# Patient Record
Sex: Male | Born: 1965 | Race: White | Hispanic: No | Marital: Married | Smoking: Never smoker
Health system: Southern US, Community
[De-identification: ages and names within clinical notes are randomized; demographics above are authoritative.]

## PROBLEM LIST (undated history)

## (undated) DIAGNOSIS — H409 Unspecified glaucoma: Secondary | ICD-10-CM

## (undated) DIAGNOSIS — H548 Legal blindness, as defined in USA: Secondary | ICD-10-CM

## (undated) DIAGNOSIS — H3553 Other dystrophies primarily involving the sensory retina: Secondary | ICD-10-CM

---

## 2019-04-29 ENCOUNTER — Emergency Department (HOSPITAL_COMMUNITY): Payer: Federal, State, Local not specified - PPO

## 2019-04-29 ENCOUNTER — Other Ambulatory Visit: Payer: Self-pay

## 2019-04-29 ENCOUNTER — Emergency Department (HOSPITAL_BASED_OUTPATIENT_CLINIC_OR_DEPARTMENT_OTHER): Payer: Federal, State, Local not specified - PPO

## 2019-04-29 ENCOUNTER — Emergency Department (HOSPITAL_BASED_OUTPATIENT_CLINIC_OR_DEPARTMENT_OTHER)
Admission: EM | Admit: 2019-04-29 | Discharge: 2019-04-30 | Disposition: A | Discharge status: Home / Self Care | Payer: Federal, State, Local not specified - PPO | Attending: Emergency Medicine | Admitting: Emergency Medicine

## 2019-04-29 ENCOUNTER — Encounter (HOSPITAL_BASED_OUTPATIENT_CLINIC_OR_DEPARTMENT_OTHER): Payer: Self-pay

## 2019-04-29 DIAGNOSIS — Y999 Unspecified external cause status: Secondary | ICD-10-CM | POA: Diagnosis not present

## 2019-04-29 DIAGNOSIS — W0110XA Fall on same level from slipping, tripping and stumbling with subsequent striking against unspecified object, initial encounter: Secondary | ICD-10-CM | POA: Diagnosis not present

## 2019-04-29 DIAGNOSIS — S199XXA Unspecified injury of neck, initial encounter: Secondary | ICD-10-CM | POA: Diagnosis present

## 2019-04-29 DIAGNOSIS — M542 Cervicalgia: Secondary | ICD-10-CM

## 2019-04-29 DIAGNOSIS — S14105A Unspecified injury at C5 level of cervical spinal cord, initial encounter: Secondary | ICD-10-CM | POA: Diagnosis not present

## 2019-04-29 DIAGNOSIS — Z20828 Contact with and (suspected) exposure to other viral communicable diseases: Secondary | ICD-10-CM | POA: Insufficient documentation

## 2019-04-29 DIAGNOSIS — R200 Anesthesia of skin: Secondary | ICD-10-CM | POA: Diagnosis not present

## 2019-04-29 DIAGNOSIS — Y929 Unspecified place or not applicable: Secondary | ICD-10-CM | POA: Insufficient documentation

## 2019-04-29 DIAGNOSIS — Y939 Activity, unspecified: Secondary | ICD-10-CM | POA: Insufficient documentation

## 2019-04-29 HISTORY — DX: Legal blindness, as defined in USA: H54.8

## 2019-04-29 HISTORY — DX: Other dystrophies primarily involving the sensory retina: H35.53

## 2019-04-29 HISTORY — DX: Unspecified glaucoma: H40.9

## 2019-04-29 LAB — BASIC METABOLIC PANEL
Anion gap: 12 (ref 5–15)
BUN: 7 mg/dL (ref 6–20)
CO2: 20 mmol/L — ABNORMAL LOW (ref 22–32)
Calcium: 8.6 mg/dL — ABNORMAL LOW (ref 8.9–10.3)
Chloride: 98 mmol/L (ref 98–111)
Creatinine, Ser: 0.84 mg/dL (ref 0.61–1.24)
GFR calc Af Amer: >60 mL/min (ref >60)
GFR calc non Af Amer: >60 mL/min (ref >60)
Glucose, Bld: 114 mg/dL — ABNORMAL HIGH (ref 70–99)
Potassium: 4 mmol/L (ref 3.5–5.1)
Sodium: 130 mmol/L — ABNORMAL LOW (ref 135–145)

## 2019-04-29 LAB — CBC
HCT: 42 % (ref 39.0–52.0)
Hemoglobin: 15 g/dL (ref 13.0–17.0)
MCH: 33.6 pg (ref 26.0–34.0)
MCHC: 35.7 g/dL (ref 30.0–36.0)
MCV: 94.2 fL (ref 80.0–100.0)
Platelets: 143 10*3/uL — ABNORMAL LOW (ref 150–400)
RBC: 4.46 MIL/uL (ref 4.22–5.81)
RDW: 11.5 % (ref 11.5–15.5)
WBC: 10.5 10*3/uL (ref 4.0–10.5)
nRBC: 0 % (ref 0.0–0.2)

## 2019-04-29 LAB — SARS CORONAVIRUS 2 AG (30 MIN TAT): SARS Coronavirus 2 Ag: NEGATIVE

## 2019-04-29 NOTE — ED Notes (Signed)
Called [facility] - s/w Kim - Advised Covid test was negative - patient ready to be transported to ED - Maudie Mercury advised it would be a while

## 2019-04-29 NOTE — ED Provider Notes (Signed)
[facility] EMERGENCY DEPARTMENT Provider Note   CSN: [id number] Arrival date & time: 04/29/19  1845    History   Chief Complaint Chief Complaint  Patient presents with   Fall    HPI Seth Schaefer is a 53 y.o. male.     Patient legally blind presents with neck pain after mechanical fall.  Patient tripped and fell and hit the back of his neck and had sudden pain and then numbness across both upper arms.  This extended now he has numbness in his hands bilateral and to temperature they feel different.  No history of neck surgery.  No leg symptoms.  Patient has not urinated yet.  No other significant injuries     Past Medical History:  Diagnosis Date   Glaucoma    Legally blind    Stargardts disease     There are no active problems to display for this patient.   History reviewed. No pertinent surgical history.      Home Medications    Prior to Admission medications   Not on File    Family History History reviewed. No pertinent family history.  Social History Social History   Tobacco Use   Smoking status: Never Smoker   Smokeless tobacco: Current User  Substance Use Topics   Alcohol use: Yes    Comment: daily   Drug use: Never     Allergies   Patient has no known allergies.   Review of Systems Review of Systems  Constitutional: Negative for chills and fever.  HENT: Negative for congestion.   Eyes: Negative for visual disturbance.  Respiratory: Negative for shortness of breath.   Cardiovascular: Negative for chest pain.  Gastrointestinal: Negative for abdominal pain and vomiting.  Genitourinary: Negative for dysuria and flank pain.  Musculoskeletal: Positive for neck pain. Negative for back pain and neck stiffness.  Skin: Negative for rash.  Neurological: Positive for numbness. Negative for light-headedness and headaches.     Physical Exam Updated Vital Signs BP (!) 184/90 (BP Location: Right Arm)    Pulse 86    Temp 99 F  (37.2 C) (Oral)    Resp 18    Ht 5\' 8"  (1.727 m)    Wt 81.6 kg    SpO2 98%    BMI 27.37 kg/m   Physical Exam Vitals signs and nursing note reviewed.  Constitutional:      Appearance: He is well-developed.  HENT:     Head: Normocephalic and atraumatic.  Eyes:     General:        Right eye: No discharge.        Left eye: No discharge.     Conjunctiva/sclera: Conjunctivae normal.  Neck:     Musculoskeletal: Normal range of motion and neck supple.     Trachea: No tracheal deviation.  Cardiovascular:     Rate and Rhythm: Normal rate and regular rhythm.  Pulmonary:     Effort: Pulmonary effort is normal.     Breath sounds: Normal breath sounds.  Abdominal:     General: There is no distension.     Palpations: Abdomen is soft.     Tenderness: There is no abdominal tenderness. There is no guarding.  Musculoskeletal:     Comments: Patient has subjective numbness to hands and forearms bilateral palpation.  Patient has 5+ strength with flexion-extension of major joints in upper and lower extremities.  C-collar in place patient has mild midline and paraspinal mid cervical tenderness.  Skin:  General: Skin is warm.     Findings: No rash.  Neurological:     Mental Status: He is alert and oriented to person, place, and time.     GCS: GCS eye subscore is 4. GCS verbal subscore is 5. GCS motor subscore is 6.     Comments: Patient has normal strength upper and lower extremities equal bilateral.  Equal lower extremity reflexes 2+.      ED Treatments / Results  Labs (all labs ordered are listed, but only abnormal results are displayed) Labs Reviewed  CBC - Abnormal; Notable for the following components:      Result Value   Platelets 143 (*)    All other components within normal limits  BASIC METABOLIC PANEL - Abnormal; Notable for the following components:   Sodium 130 (*)    CO2 20 (*)    Glucose, Bld 114 (*)    Calcium 8.6 (*)    All other components within normal limits  SARS  CORONAVIRUS 2 (HOSP ORDER, PERFORMED IN [facility] VIA [facility] ID)    EKG None  Radiology Ct Head Wo Contrast  Result Date: 04/29/2019 CLINICAL DATA:  Head injury after fall. Positive loss of consciousness. EXAM: CT HEAD WITHOUT CONTRAST CT CERVICAL SPINE WITHOUT CONTRAST TECHNIQUE: Multidetector CT imaging of the head and cervical spine was performed following the standard protocol without intravenous contrast. Multiplanar CT image reconstructions of the cervical spine were also generated. COMPARISON:  None. FINDINGS: CT HEAD FINDINGS Brain: No evidence of acute infarction, hemorrhage, hydrocephalus, extra-axial collection or mass lesion/mass effect. Vascular: No hyperdense vessel or unexpected calcification. Skull: Normal. Negative for fracture or focal lesion. Sinuses/Orbits: No acute finding. Other: None. CT CERVICAL SPINE FINDINGS Alignment: Normal. Skull base and vertebrae: No acute fracture. No primary bone lesion or focal pathologic process. Soft tissues and spinal canal: No prevertebral fluid or swelling. No visible canal hematoma. Disc levels: Moderate degenerative disc disease is noted at C5-6 and C6-7 with anterior posterior osteophyte formation. Upper chest: Negative. Other: None. IMPRESSION: Normal head CT. Moderate multilevel degenerative disc disease. No acute abnormality seen in the cervical spine. Electronically Signed   By: Marijo Conception M.D.   On: 04/29/2019 20:16   Ct Cervical Spine Wo Contrast  Result Date: 04/29/2019 CLINICAL DATA:  Head injury after fall. Positive loss of consciousness. EXAM: CT HEAD WITHOUT CONTRAST CT CERVICAL SPINE WITHOUT CONTRAST TECHNIQUE: Multidetector CT imaging of the head and cervical spine was performed following the standard protocol without intravenous contrast. Multiplanar CT image reconstructions of the cervical spine were also generated. COMPARISON:  None. FINDINGS: CT HEAD FINDINGS Brain: No evidence of acute infarction, hemorrhage,  hydrocephalus, extra-axial collection or mass lesion/mass effect. Vascular: No hyperdense vessel or unexpected calcification. Skull: Normal. Negative for fracture or focal lesion. Sinuses/Orbits: No acute finding. Other: None. CT CERVICAL SPINE FINDINGS Alignment: Normal. Skull base and vertebrae: No acute fracture. No primary bone lesion or focal pathologic process. Soft tissues and spinal canal: No prevertebral fluid or swelling. No visible canal hematoma. Disc levels: Moderate degenerative disc disease is noted at C5-6 and C6-7 with anterior posterior osteophyte formation. Upper chest: Negative. Other: None. IMPRESSION: Normal head CT. Moderate multilevel degenerative disc disease. No acute abnormality seen in the cervical spine. Electronically Signed   By: Marijo Conception M.D.   On: 04/29/2019 20:16    Procedures Procedures (including critical care time)  Medications Ordered in ED Medications - No data to display   Initial Impression / Assessment  and Plan / ED Course  I have reviewed the triage vital signs and the nursing notes.  Pertinent labs & imaging results that were available during my care of the patient were reviewed by me and considered in my medical decision making (see chart for details).       Patient presents with mechanical fall and neck injury.  Concern for musculoskeletal versus central cord with numbness in both arms/hands.  CT scan obtained no acute findings.  Discussed with Dr. Jacqulyn Bathlong to transfer for MRI this evening of cervical spine.  Basic blood work performed, sodium 130, no other significant findings.    Final Clinical Impressions(s) / ED Diagnoses   Final diagnoses:  Bilateral hand numbness  Acute neck pain    ED Discharge Orders    None       Blane OharaZavitz, Arsenia Goracke, MD 04/30/19 0009

## 2019-04-29 NOTE — ED Triage Notes (Signed)
Pt sates he tripped/fell ~330pm-c/o pain to posterior neck and numbness to bilat UE-NAD-steady gait

## 2019-04-29 NOTE — ED Notes (Signed)
Report  Called to [facility] ED [place] ed

## 2019-04-29 NOTE — ED Notes (Signed)
Called [facility] - s/w Maudie Mercury - requested consult with [facility] ED physician - patient will need to be transported and get an MRI

## 2019-04-29 NOTE — ED Triage Notes (Signed)
Pt arrives via [facility] from med center after pt fell. No LOC, no blood thinners. C-collar stabilization in place. CT at med center negative, to [facility] ED for MRI. Pt c/o of tingling pain in bilateral hands.

## 2019-04-30 NOTE — ED Notes (Signed)
Pt unable to provide e-signature at discharge.

## 2019-04-30 NOTE — ED Notes (Signed)
Pt describes pain as a sharp, shooting, tingling sensation that that radiates through his fingertips intermittently. Pain score 7/10

## 2019-04-30 NOTE — Consult Note (Signed)
Chief Complaint   Chief Complaint  Patient presents with  . Fall    HPI   Consult requested by: Dr Clayborne DanaMesner Reason for consult: cervical cord contusion  HPI: Seth Schaefer is a 53 y.o. male who presented to [facility] for evaluation of neck pain after a mechanical fall. Reports tripping over rug and striking back of neck. Developed immediate neck pain and interscapular N/T that radiated down BUE. Due to concern over cervical cord injury, [facility]. MRI revealed mild cord contusion at C5. NSY consultation requested. Since the fall which occurred >12 hours ago, symptoms have been gradually improving. His only remaining symptom is intermittent, sharp/shooting pain in digits 2&3 bilaterally. He denies weakness in BUE/BLE extremities, bowel/bladder dysfunction, N/T.  There are no active problems to display for this patient.   PMH: Past Medical History:  Diagnosis Date  . Glaucoma   . Legally blind   . Stargardts disease     PSH: History reviewed. No pertinent surgical history.  (Not in a hospital admission)   SH: Social History   Tobacco Use  . Smoking status: Never Smoker  . Smokeless tobacco: Current User  Substance Use Topics  . Alcohol use: Yes    Comment: daily  . Drug use: Never    MEDS: Prior to Admission medications   Not on File    ALLERGY: No Known Allergies  Social History   Tobacco Use  . Smoking status: Never Smoker  . Smokeless tobacco: Current User  Substance Use Topics  . Alcohol use: Yes    Comment: daily     History reviewed. No pertinent family history.   ROS   Review of Systems  Constitutional: Negative.   HENT: Negative.   Eyes: Negative.   Respiratory: Negative.   Cardiovascular: Negative.   Gastrointestinal: Negative.   Genitourinary: Negative.   Musculoskeletal: Negative for myalgias and neck pain.  Skin: Negative.   Neurological: Positive for tingling. Negative for dizziness, tremors,  sensory change, speech change, focal weakness, seizures, loss of consciousness, weakness and headaches.    Exam   Vitals:   04/29/19 2152 04/29/19 2315  BP: (!) 153/84 (!) 184/90  Pulse: 78 86  Resp: 18 18  Temp:  99 F (37.2 C)  SpO2: 100% 98%   General appearance: WDWN, NAD Eyes: No scleral injection Cardiovascular: Regular rate and rhythm without murmurs, rubs, gallops. No edema or variciosities. Distal pulses normal. Pulmonary: Effort normal, non-labored breathing Musculoskeletal:     Muscle tone upper extremities: Normal    Muscle tone lower extremities: Normal    Motor exam: Upper Extremities Deltoid Bicep Tricep Grip  Right 5/5 5/5 5/5 5/5  Left 5/5 5/5 5/5 5/5   Lower Extremity IP Quad PF DF EHL  Right 5/5 5/5 5/5 5/5 5/5  Left 5/5 5/5 5/5 5/5 5/5   Neurological Mental Status:    - Patient is awake, alert, oriented to person, place, month, year, and situation    - Patient is able to give a clear and coherent history.    - No signs of aphasia or neglect Cranial Nerves    - II: Visual Fields are full. PERRL    - III/IV/VI: EOMI without ptosis or diploplia.     - V: Facial sensation is grossly normal    - VII: Facial movement is symmetric.     - VIII: hearing is intact to voice    - X: Uvula elevates symmetrically    -  XI: Shoulder shrug is symmetric.    - XII: tongue is midline without atrophy or fasciculations.  Sensory: Sensation grossly intact to LT Deep Tendon Reflexes    - 3+ and symmetric in the biceps and patellae.  Plantars   - Toes are downgoing bilaterally.  Cerebellar    - FNF and HKS are intact bilaterally   Results - Imaging/Labs   Results for orders placed or performed during the hospital encounter of 04/29/19 (from the past 48 hour(s))  CBC     Status: Abnormal   Collection Time: 04/29/19  7:36 PM  Result Value Ref Range   WBC 10.5 4.0 - 10.5 K/uL   RBC 4.46 4.22 - 5.81 MIL/uL   Hemoglobin 15.0 13.0 - 17.0 g/dL   HCT 21.342.0 08.639.0 - 57.852.0  %   MCV 94.2 80.0 - 100.0 fL   MCH 33.6 26.0 - 34.0 pg   MCHC 35.7 30.0 - 36.0 g/dL   RDW 46.911.5 62.911.5 - 52.815.5 %   Platelets 143 (L) 150 - 400 K/uL   nRBC 0.0 0.0 - 0.2 %    Comment: Performed at [facility] [place], [address] [place], [place] [address]  Basic metabolic panel     Status: Abnormal   Collection Time: 04/29/19  7:36 PM  Result Value Ref Range   Sodium 130 (L) 135 - 145 mmol/L   Potassium 4.0 3.5 - 5.1 mmol/L   Chloride 98 98 - 111 mmol/L   CO2 20 (L) 22 - 32 mmol/L   Glucose, Bld 114 (H) 70 - 99 mg/dL   BUN 7 6 - 20 mg/dL   Creatinine, Ser 4.010.84 0.61 - 1.24 mg/dL   Calcium 8.6 (L) 8.9 - 10.3 mg/dL   GFR calc non Af Amer >60 >60 mL/min   GFR calc Af Amer >60 >60 mL/min   Anion gap 12 5 - 15    Comment: Performed at [facility] [place], [address] [place], [place] [address]  SARS Coronavirus 2 (Hosp order,Performed in [facility] lab via [facility] ID)     Status: None   Collection Time: 04/29/19  8:54 PM   Specimen: Dry Nasal Swab ([facility] ID Now)  Result Value Ref Range   SARS Coronavirus 2 ([facility]ott ID Now) NEGATIVE NEGATIVE    Comment: (NOTE) SARS-CoV-2 target nucleic acids are NOT DETECTED. The SARS-CoV-2 RNA is generally detectable in upper and lower respiratory specimens during the acute phase of infection.  Negativeresults do not preclude SARS-CoV-2 infection, do not rule out coinfections with other pathogens, and should not be used as the  sole basis for treatment or other patient management decisions.  Negative results must be combined with clinical observations, patient history, and epidemiological information. The expected result is Negative. Fact Sheet for Patients: http://www.graves-ford.org/https://www.fda.gov/media/136524/download Fact Sheet for Healthcare Providers: EnviroConcern.sihttps://www.fda.gov/media/136523/download This test is not yet approved or cleared by the [place] FDA and  has been authorized for detection and/or diagnosis of SARS-CoV-2 by FDA under an  Emergency Use Authorization (EUA).  This EUA will remain in effect (meaning this test can be used) for the duration of  the COVID19 declaration under Section 5 64(b)(1) of the Act, 21 U.S.C.  section 725 605 [id number] 3(b)(1), unless the authorization is terminated or revoked sooner. Performed at [facility] [place], [address] [place], [place] [address]     Ct Head Wo Contrast  Result Date: 04/29/2019 CLINICAL DATA:  Head injury after fall. Positive loss of consciousness. EXAM: CT  HEAD WITHOUT CONTRAST CT CERVICAL SPINE WITHOUT CONTRAST TECHNIQUE: Multidetector CT imaging of the head and cervical spine was performed following the standard protocol without intravenous contrast. Multiplanar CT image reconstructions of the cervical spine were also generated. COMPARISON:  None. FINDINGS: CT HEAD FINDINGS Brain: No evidence of acute infarction, hemorrhage, hydrocephalus, extra-axial collection or mass lesion/mass effect. Vascular: No hyperdense vessel or unexpected calcification. Skull: Normal. Negative for fracture or focal lesion. Sinuses/Orbits: No acute finding. Other: None. CT CERVICAL SPINE FINDINGS Alignment: Normal. Skull base and vertebrae: No acute fracture. No primary bone lesion or focal pathologic process. Soft tissues and spinal canal: No prevertebral fluid or swelling. No visible canal hematoma. Disc levels: Moderate degenerative disc disease is noted at C5-6 and C6-7 with anterior posterior osteophyte formation. Upper chest: Negative. Other: None. IMPRESSION: Normal head CT. Moderate multilevel degenerative disc disease. No acute abnormality seen in the cervical spine. Electronically Signed   By: Marijo Conception M.D.   On: 04/29/2019 20:16   Ct Cervical Spine Wo Contrast  Result Date: 04/29/2019 CLINICAL DATA:  Head injury after fall. Positive loss of consciousness. EXAM: CT HEAD WITHOUT CONTRAST CT CERVICAL SPINE WITHOUT CONTRAST TECHNIQUE: Multidetector CT imaging of the head and cervical  spine was performed following the standard protocol without intravenous contrast. Multiplanar CT image reconstructions of the cervical spine were also generated. COMPARISON:  None. FINDINGS: CT HEAD FINDINGS Brain: No evidence of acute infarction, hemorrhage, hydrocephalus, extra-axial collection or mass lesion/mass effect. Vascular: No hyperdense vessel or unexpected calcification. Skull: Normal. Negative for fracture or focal lesion. Sinuses/Orbits: No acute finding. Other: None. CT CERVICAL SPINE FINDINGS Alignment: Normal. Skull base and vertebrae: No acute fracture. No primary bone lesion or focal pathologic process. Soft tissues and spinal canal: No prevertebral fluid or swelling. No visible canal hematoma. Disc levels: Moderate degenerative disc disease is noted at C5-6 and C6-7 with anterior posterior osteophyte formation. Upper chest: Negative. Other: None. IMPRESSION: Normal head CT. Moderate multilevel degenerative disc disease. No acute abnormality seen in the cervical spine. Electronically Signed   By: Marijo Conception M.D.   On: 04/29/2019 20:16   Mr Cervical Spine Wo Contrast  Result Date: 04/30/2019 CLINICAL DATA:  53 y/o M; fall. Posterior neck pain and numbness to the lower extremities. EXAM: MRI CERVICAL SPINE WITHOUT CONTRAST TECHNIQUE: Multiplanar, multisequence MR imaging of the cervical spine was performed. No intravenous contrast was administered. COMPARISON:  None. FINDINGS: Alignment: Straightening of cervical lordosis without static malalignment. Vertebrae: No bone marrow edema to suggest acute fracture. No loss of vertebral body height. There is mild prevertebral edema extending from the C2 level to below the field of view. There is very mild edema within the anterior annulus at C4-5 and C6-7 at the levels of prominent anterior endplate osteophytes. Additionally, there is mild edema surrounding the bilateral C3-4 and C4-5 facets as well as the C4 and C5 spinous processes. There is no  associated malalignment of the facets, spinous processes, or vertebral bodies. No ligament discontinuity is identified. Cord: Increased T2 signal within the spinal cord sent and posterior horns, greater on the left, at the C5 level (series 8, image 20). No additional level of abnormal cord signal. Posterior Fossa, vertebral arteries, paraspinal tissues: As above. Disc levels: C2-3: Right greater than left uncovertebral and facet hypertrophy with mild right foraminal stenosis. No canal stenosis. C3-4: No significant disc displacement, foraminal stenosis, or canal stenosis. C4-5: Mild disc bulge. No significant foraminal or spinal canal stenosis. C5-6: Disc osteophyte complex with  prominent bilateral uncovertebral hypertrophy and mild facet hypertrophy. Moderate spinal canal stenosis with disc contact on the anterior cord and mild cord flattening. Moderate right and severe left neural foraminal stenosis. C6-7: Disc osteophyte complex with right greater than left uncovertebral and facet hypertrophy. Moderate right and mild left neural foraminal stenosis. Mild spinal canal stenosis. C7-T1: No significant disc displacement, foraminal stenosis, or canal stenosis. IMPRESSION: 1. Mild prevertebral edema and surrounding C3-4 and C4-5 facets as well as C4-5 spinous processes. No static malalignment, bone marrow edema, or ligament discontinuity. Findings probably represent mild ligamentous strain and possible facet capsular injury centered at C3-C5. 2. Mildly increased C5 cord signal, probably a mild cord contusion due to cervical trauma in the setting of a stenotic spinal canal due to degenerative disease at C5-6. These results were called by telephone at the time of interpretation on 04/30/2019 at 12:29 am to Dr. Erin HearingMessner, who verbally acknowledged these results. Electronically Signed   By: Mitzi HansenLance  Furusawa-Stratton M.D.   On: 04/30/2019 00:35   Impression/Plan   53 y.o. 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 No evidence of malalignment, fracture or ligament discontinuity on MRI. There is edema surrounding facet/spinous processes C3-5 and prevertebral edema.  He is neurologically intact on exam. Symptoms overall appear to be improving and essentially resolved. Case reviewed with Dr Conchita ParisNundkumar. No need for immediate NS intervention, although will likely need to have C5-6 spinal stenosis addressed in the future. Believe he is safe for d/c home with outpt follow up. Maintain aspen c collar at all times. Strong return precautions discussed.  Cindra PresumeVincent Camisha Srey, PA-C [facility]

## 2019-04-30 NOTE — ED Provider Notes (Signed)
2:02 AM Assumed care from Dr. Reather Converse at [facility] where there is no MRI, please see their note for full history, physical and decision making until this point. In brief this is a 53 y.o. year old male who presented to the ED tonight with Fall     Patient with a hyperextension injury after a fall.  Has paresthesias and weakness in his arms concerning for central cord sent here for MRI.  On my evaluation patient does have grip strength weakness and radiculopathic pain down to his second and third digits.  Does seem to be slightly improving he states.  MRI with evidence of cord contusion.  Discussed with neurosurgery who came and saw the patient and evaluated and discussed with attending and the patient continues to improve.  They will see in the office on Wednesday but do not see any need for admission at this time.  Patient sent home in Aspen collar and will call for appointment.  Discharge instructions, including strict return precautions for new or worsening symptoms, given. Patient and/or family verbalized understanding and agreement with the plan as described.   Labs, studies and imaging reviewed by myself and considered in medical decision making if ordered. Imaging interpreted by radiology.  Labs Reviewed  CBC - Abnormal; Notable for the following components:      Result Value   Platelets 143 (*)    All other components within normal limits  BASIC METABOLIC PANEL - Abnormal; Notable for the following components:   Sodium 130 (*)    CO2 20 (*)    Glucose, Bld 114 (*)    Calcium 8.6 (*)    All other components within normal limits  SARS CORONAVIRUS 2 (HOSP ORDER, PERFORMED IN [place] [facility] VIA [facility] ID)    MR Cervical Spine Wo Contrast  Final Result    CT Cervical Spine Wo Contrast  Final Result    CT Head Wo Contrast  Final Result      No follow-ups on file.    Koleen Celia, Corene Cornea, MD 04/30/19 [phone]

## 2021-01-17 IMAGING — CT CT CERVICAL SPINE WITHOUT CONTRAST
3 of 7 series · 12 of 33 positions shown, 13 images · non-contrast
Comparison: None.

CLINICAL DATA: Head injury after fall. Positive loss of
consciousness.

EXAM:
CT HEAD WITHOUT CONTRAST
CT CERVICAL SPINE WITHOUT CONTRAST
TECHNIQUE: Multidetector CT imaging of the head and cervical spine was
performed following the standard protocol without intravenous
contrast. Multiplanar CT image reconstructions of the cervical spine
were also generated.

[Series 9: coronals · coronal · 0.30mm/px · 3 of 65 slices shown]
[im 17/65  bone]
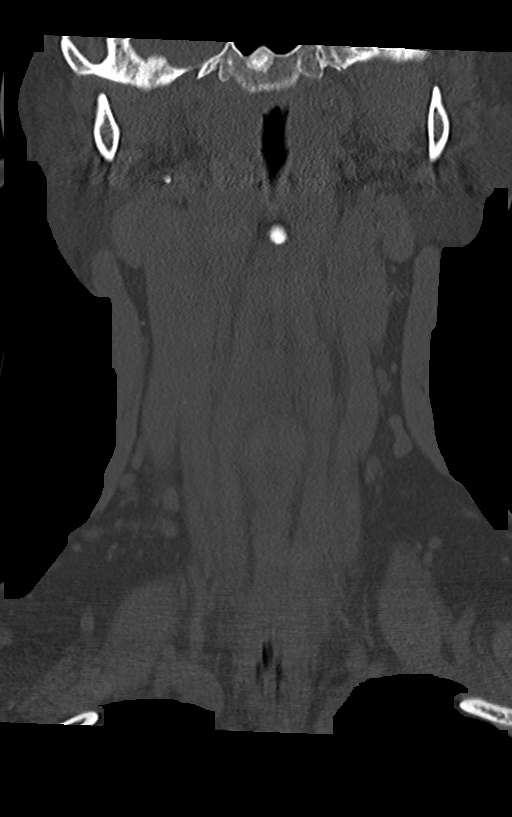
[im 33/65  bone]
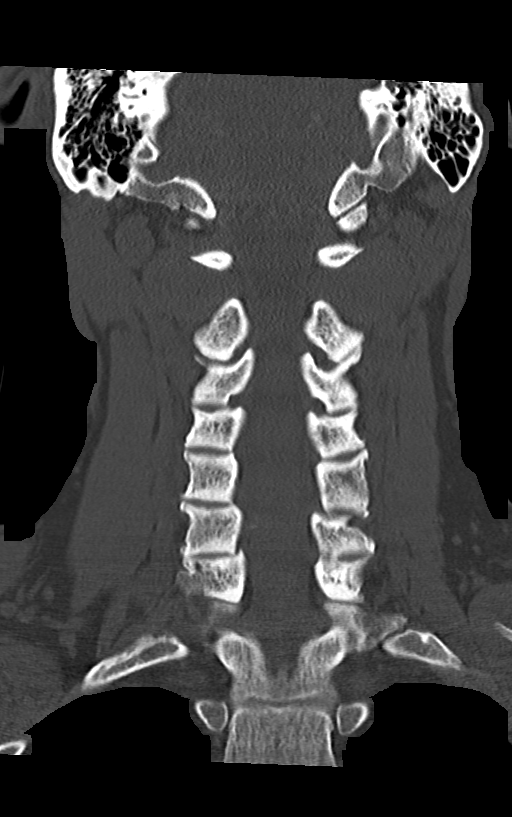
[im 49/65  bone]
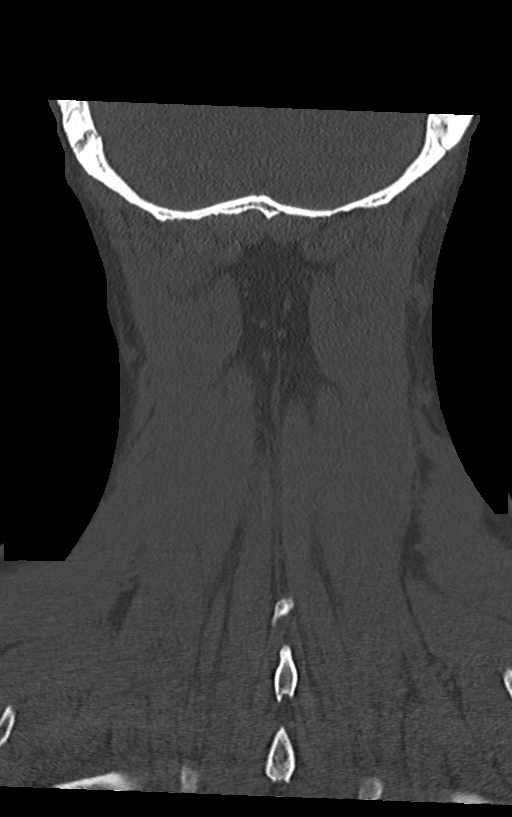

[Series 10: sagittals · sagittal · 0.25mm/px · 5 of 62 slices shown]
[im 11/62  bone]
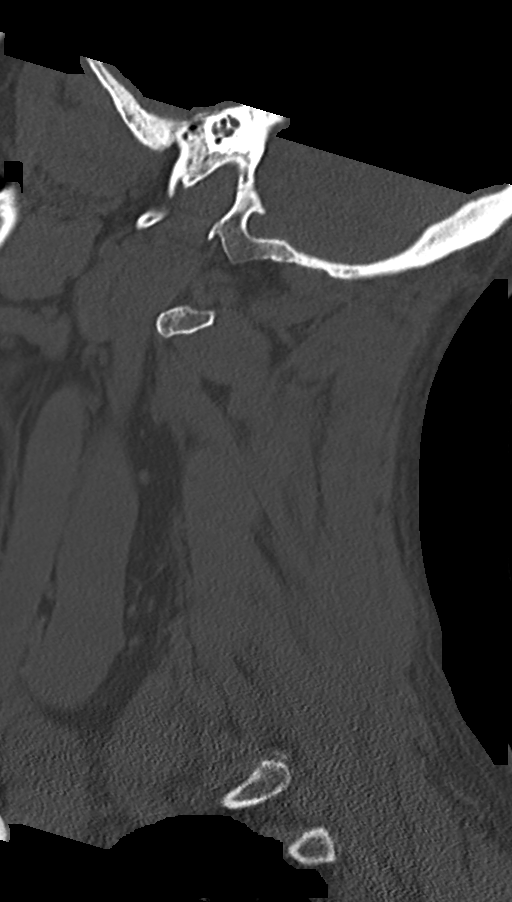
[im 21/62  bone]
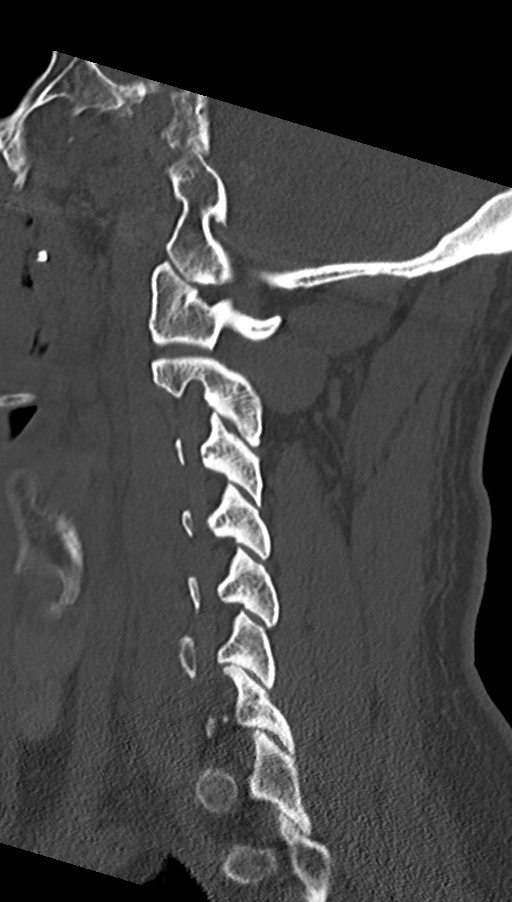
[im 31/62  bone]
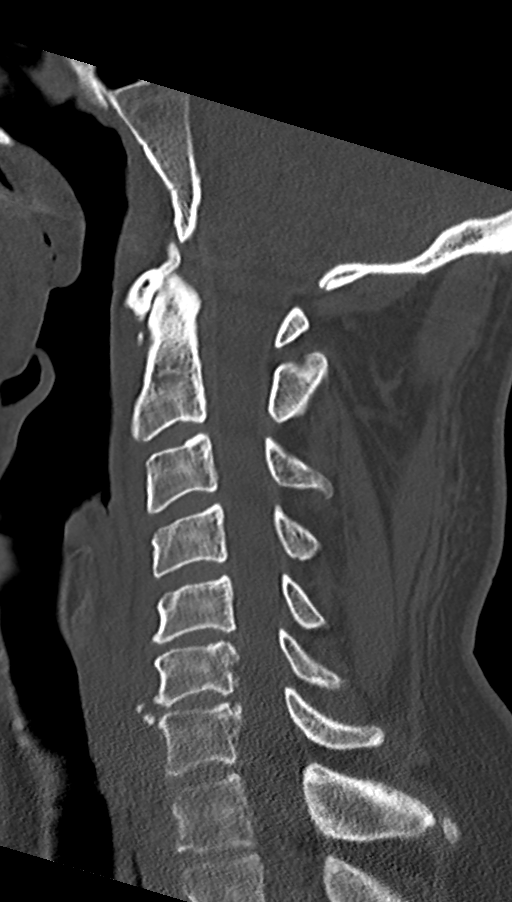
[im 41/62  bone]
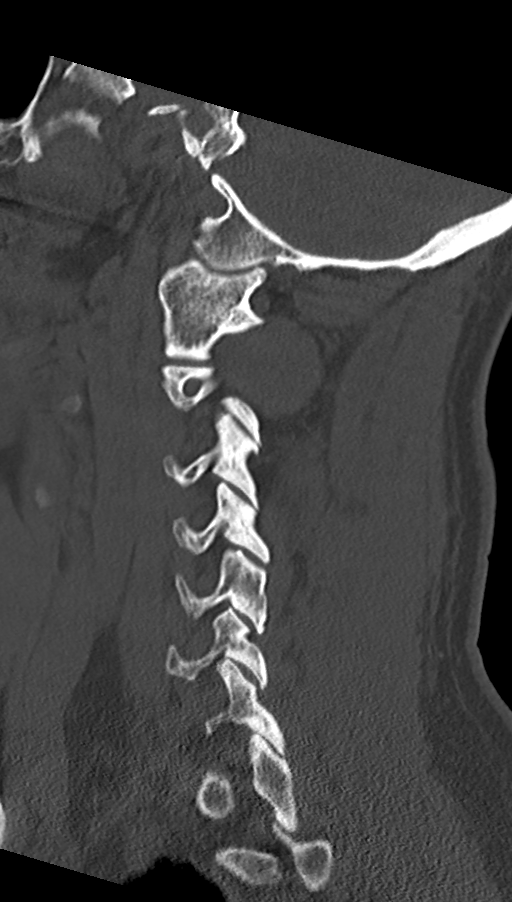
[im 51/62  bone]
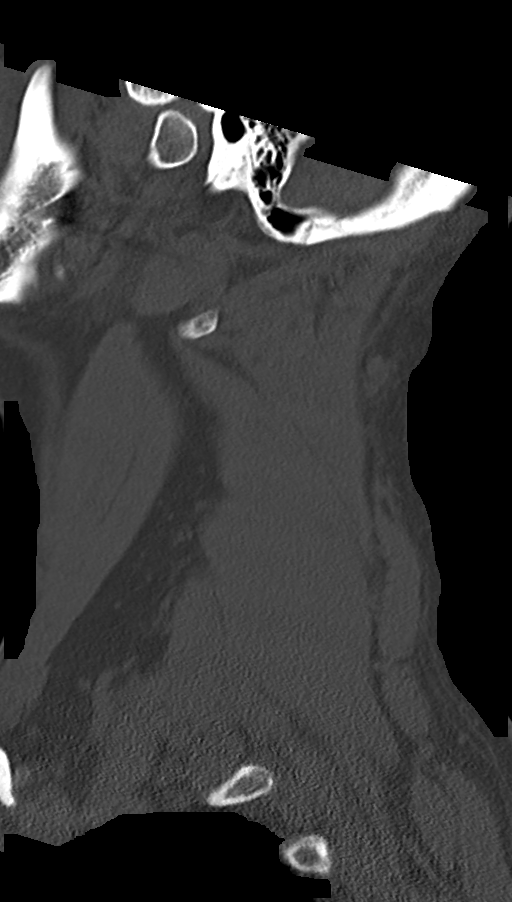

[Series 11: orthogonals · axial · 0.25mm/px · z∈[+1212,+1365]mm · 4 of 120 slices shown, 5 images]
[im 20/120  soft-tissue]
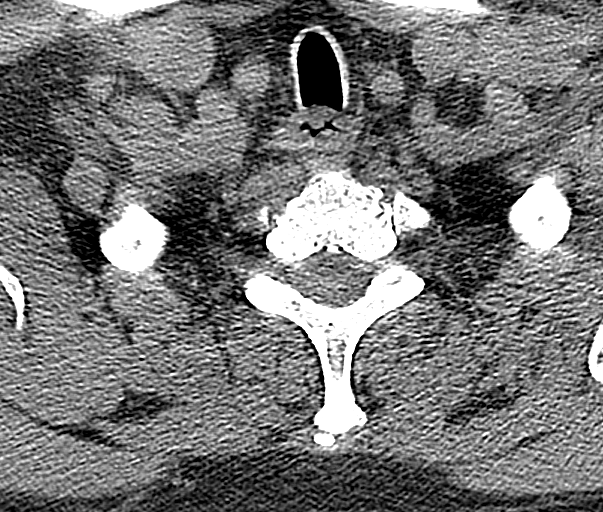
[im 20/120  bone]
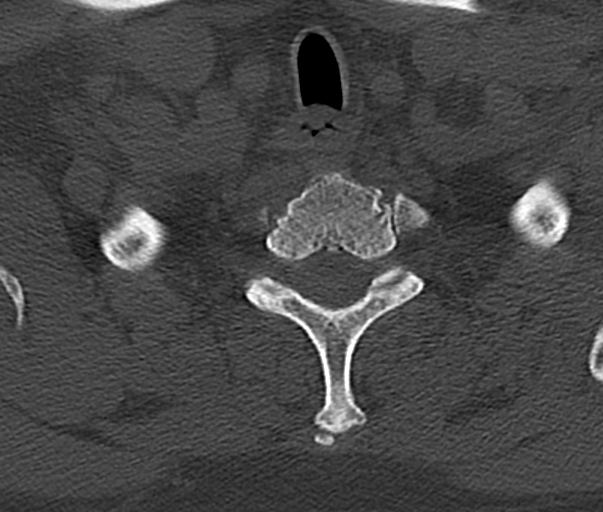
[im 40/120  bone]
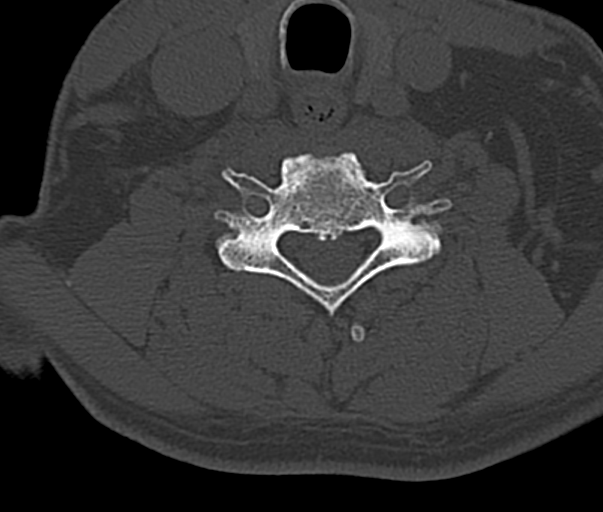
[im 80/120  bone]
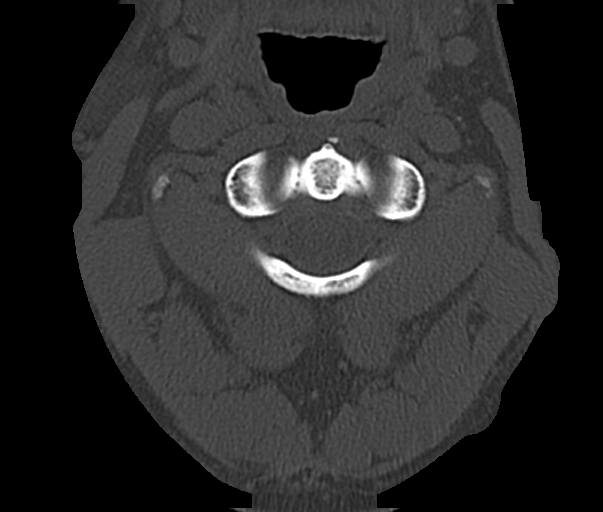
[im 100/120  bone]
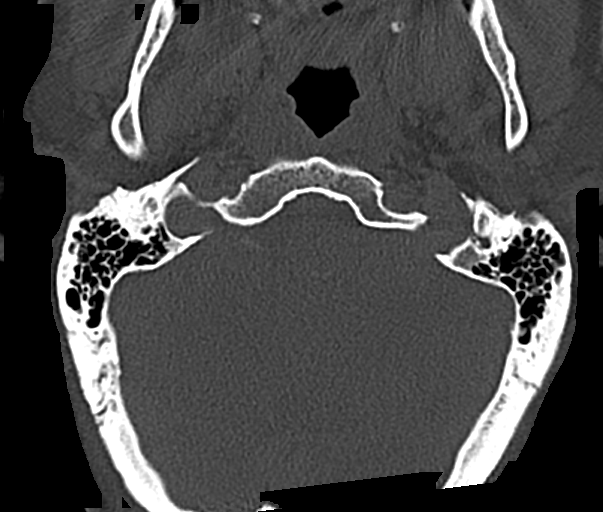

[12 of 33 positions shown; findings below may reference images not displayed]

FINDINGS: CT HEAD FINDINGS

Brain: No evidence of acute infarction, hemorrhage, hydrocephalus,
extra-axial collection or mass lesion/mass effect.

Vascular: No hyperdense vessel or unexpected calcification.

Skull: Normal. Negative for fracture or focal lesion.

Sinuses/Orbits: No acute finding.

Other: None.

CT CERVICAL SPINE FINDINGS

Alignment: Normal.

Skull base and vertebrae: No acute fracture. No primary bone lesion
or focal pathologic process.

Soft tissues and spinal canal: No prevertebral fluid or swelling. No
visible canal hematoma.

Disc levels: Moderate degenerative disc disease is noted at C5-6 and
C6-7 with anterior posterior osteophyte formation.

Upper chest: Negative.

Other: None.
IMPRESSION: Normal head CT.

Moderate multilevel degenerative disc disease. No acute abnormality
seen in the cervical spine.
